# Patient Record
Sex: Female | Born: 1960 | Race: White | Hispanic: No | Marital: Married | Smoking: Never smoker
Health system: Southern US, Community
[De-identification: ages and names within clinical notes are randomized; demographics above are authoritative.]

## PROBLEM LIST (undated history)

## (undated) DIAGNOSIS — E7800 Pure hypercholesterolemia, unspecified: Secondary | ICD-10-CM

## (undated) HISTORY — DX: Pure hypercholesterolemia, unspecified: E78.00

## (undated) HISTORY — PX: OVARIAN CYST REMOVAL: SHX89

## (undated) HISTORY — PX: TONSILLECTOMY: SUR1361

---

## 1997-09-23 ENCOUNTER — Other Ambulatory Visit: Admission: RE | Admit: 1997-09-23 | Discharge: 1997-09-23 | Payer: Self-pay | Admitting: Obstetrics and Gynecology

## 1999-12-12 ENCOUNTER — Inpatient Hospital Stay (HOSPITAL_COMMUNITY): Admission: AD | Admit: 1999-12-12 | Discharge: 1999-12-14 | Payer: Self-pay | Admitting: Obstetrics and Gynecology

## 2000-01-26 ENCOUNTER — Other Ambulatory Visit: Admission: RE | Admit: 2000-01-26 | Discharge: 2000-01-26 | Payer: Self-pay | Admitting: Obstetrics and Gynecology

## 2004-03-22 ENCOUNTER — Ambulatory Visit: Payer: Self-pay | Admitting: Obstetrics and Gynecology

## 2005-10-04 ENCOUNTER — Ambulatory Visit: Payer: Self-pay | Admitting: Obstetrics and Gynecology

## 2007-05-01 ENCOUNTER — Ambulatory Visit: Payer: Self-pay | Admitting: Obstetrics and Gynecology

## 2008-09-02 ENCOUNTER — Ambulatory Visit: Payer: Self-pay | Admitting: Obstetrics and Gynecology

## 2009-12-02 ENCOUNTER — Ambulatory Visit: Payer: Self-pay | Admitting: Unknown Physician Specialty

## 2010-12-29 ENCOUNTER — Ambulatory Visit: Payer: Self-pay | Admitting: Unknown Physician Specialty

## 2015-05-18 ENCOUNTER — Other Ambulatory Visit: Payer: Self-pay | Admitting: *Deleted

## 2015-05-18 ENCOUNTER — Inpatient Hospital Stay
Admission: RE | Admit: 2015-05-18 | Discharge: 2015-05-18 | Disposition: A | Discharge status: Home / Self Care | Payer: Self-pay | Source: Ambulatory Visit | Attending: *Deleted | Admitting: *Deleted

## 2015-05-18 ENCOUNTER — Other Ambulatory Visit: Payer: Self-pay | Admitting: Obstetrics & Gynecology

## 2015-05-18 DIAGNOSIS — R928 Other abnormal and inconclusive findings on diagnostic imaging of breast: Secondary | ICD-10-CM

## 2015-05-18 DIAGNOSIS — Z9289 Personal history of other medical treatment: Secondary | ICD-10-CM

## 2015-05-28 ENCOUNTER — Ambulatory Visit: Payer: Self-pay

## 2015-05-28 ENCOUNTER — Ambulatory Visit
Admission: RE | Admit: 2015-05-28 | Discharge: 2015-05-28 | Disposition: A | Discharge status: Home / Self Care | Payer: BLUE CROSS/BLUE SHIELD | Source: Ambulatory Visit | Attending: Obstetrics & Gynecology | Admitting: Obstetrics & Gynecology

## 2015-05-28 DIAGNOSIS — R928 Other abnormal and inconclusive findings on diagnostic imaging of breast: Secondary | ICD-10-CM | POA: Insufficient documentation

## 2016-04-11 ENCOUNTER — Other Ambulatory Visit: Payer: Self-pay | Admitting: Internal Medicine

## 2016-04-11 DIAGNOSIS — Z1231 Encounter for screening mammogram for malignant neoplasm of breast: Secondary | ICD-10-CM

## 2016-05-23 ENCOUNTER — Ambulatory Visit: Payer: BLUE CROSS/BLUE SHIELD

## 2016-06-07 ENCOUNTER — Ambulatory Visit: Payer: BLUE CROSS/BLUE SHIELD

## 2016-07-07 ENCOUNTER — Ambulatory Visit: Payer: BLUE CROSS/BLUE SHIELD | Attending: Internal Medicine

## 2016-08-15 ENCOUNTER — Encounter: Payer: Self-pay | Admitting: Obstetrics and Gynecology

## 2016-08-16 ENCOUNTER — Encounter: Payer: Self-pay | Admitting: Obstetrics and Gynecology

## 2016-08-17 ENCOUNTER — Encounter: Payer: Self-pay | Admitting: Obstetrics and Gynecology

## 2016-08-21 ENCOUNTER — Inpatient Hospital Stay: Admission: RE | Admit: 2016-08-21 | Payer: BLUE CROSS/BLUE SHIELD | Source: Ambulatory Visit

## 2016-09-19 ENCOUNTER — Ambulatory Visit
Admission: RE | Admit: 2016-09-19 | Discharge: 2016-09-19 | Disposition: A | Discharge status: Home / Self Care | Payer: BLUE CROSS/BLUE SHIELD | Source: Ambulatory Visit | Attending: Internal Medicine | Admitting: Internal Medicine

## 2016-09-19 DIAGNOSIS — Z1231 Encounter for screening mammogram for malignant neoplasm of breast: Secondary | ICD-10-CM | POA: Diagnosis present

## 2016-10-10 ENCOUNTER — Encounter: Payer: Self-pay | Admitting: Obstetrics and Gynecology

## 2016-11-07 ENCOUNTER — Encounter: Payer: Self-pay | Admitting: Obstetrics and Gynecology

## 2016-12-06 ENCOUNTER — Encounter: Payer: Self-pay | Admitting: Obstetrics and Gynecology

## 2016-12-12 ENCOUNTER — Ambulatory Visit (INDEPENDENT_AMBULATORY_CARE_PROVIDER_SITE_OTHER): Payer: BLUE CROSS/BLUE SHIELD | Admitting: Obstetrics and Gynecology

## 2016-12-12 ENCOUNTER — Encounter: Payer: Self-pay | Admitting: Obstetrics and Gynecology

## 2016-12-12 ENCOUNTER — Other Ambulatory Visit: Payer: Self-pay | Admitting: Obstetrics and Gynecology

## 2016-12-12 VITALS — BP 124/76 | HR 65 | Ht 63.5 in | Wt 141.0 lb

## 2016-12-12 DIAGNOSIS — Z01419 Encounter for gynecological examination (general) (routine) without abnormal findings: Secondary | ICD-10-CM | POA: Diagnosis not present

## 2016-12-12 NOTE — Patient Instructions (Signed)
Thank you for enrolling in MyChart. Please follow the instructions below to securely access your online medical record. MyChart allows you to send messages to your doctor, view your test results, renew your prescriptions, schedule appointments, and more.  How Do I Sign Up? 1. In your Internet browser, go to [email or web address]. 2. Click on the New  User? link in the Sign In box.  3. Enter your MyChart Access Code exactly as it appears below. You will not need to use this code after you have completed the sign-up process. If you do not sign up before the expiration date, you must request a new code. MyChart Access Code: [email or web address] [email or web address] Expires: 02/10/2017 10:21 AM  4. Enter the last four digits of your Social Security Number (xxxx) and Date of Birth (mm/dd/yyyy) as indicated and click Next. You will be taken to the next sign-up page. 5. Create a MyChart ID. This will be your MyChart login ID and cannot be changed, so think of one that is secure and easy to remember. 6. Create a MyChart password. You can change your password at any time. 7. Enter your Password Reset Question and Answer and click Next. This can be used at a later time if you forget your password.  8. Select your communication preference, and if applicable enter your e-mail address. You will receive e-mail notification when new information is available in MyChart by choosing to receive e-mail notifications and filling in your e-mail. 9. Click Sign In. You can now view your medical record.   Additional Information If you have questions, you can email [email or web address] or call [phone] to talk to our MyChart staff. Remember, MyChart is NOT to be used for urgent needs. For medical emergencies, dial 911.

## 2016-12-12 NOTE — Progress Notes (Signed)
Subjective:   Natalie Chandler is a 56 y.o. 544P4 Caucasian female here for a routine well-woman exam.  No LMP recorded. Patient is postmenopausal.    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  Past Medical History Past Medical History:  Diagnosis Date  . High cholesterol     Past Surgical History Past Surgical History:  Procedure Laterality Date  . OVARIAN CYST REMOVAL    . TONSILLECTOMY      Gynecologic History G4P4  No LMP recorded. Patient is postmenopausal. Contraception: post menopausal status Last Pap: 2014. Results were: normal Last mammogram: 2018. Results were: normal   Obstetric History OB History  Gravida Para Term Preterm AB Living  4 4          SAB TAB Ectopic Multiple Live Births               # Outcome Date GA Lbr Len/2nd Weight Sex Delivery Anes PTL Lv  4 Para 2001    F Vag-Spont     3 Para 1997    F Vag-Spont     2 Para 1993    F Vag-Spont     1 Para 1991    F Vag-Spont         Current Medications No current outpatient prescriptions on file prior to visit.   No current facility-administered medications on file prior to visit.     Review of Systems Patient denies any headaches, blurred vision, shortness of breath, chest pain, abdominal pain, problems with bowel movements, urination, or intercourse.  Objective:  BP 124/76   Pulse 65   Ht 5' 3.5" (1.613 m)   Wt 141 lb (64 kg)   BMI 24.59 kg/m  Physical Exam  General:  Well developed, well nourished, no acute distress. She is alert  and oriented x3. Skin:  Warm and dry Neck:  Midline trachea, no thyromegaly or nodules Cardiovascular: Regular rate and rhythm, no murmur heard Lungs:  Effort normal, all lung fields clear to auscultation bilaterally Breasts:  No dominant palpable mass, retraction, or nipple discharge Abdomen:  Soft, non tender, no hepatosplenomegaly or masses Pelvic:  External genitalia is normal in appearance.  The vagina is normal in appearance. The cervix is bulbous, no CMT.  Thin prep pap is done with HR HPV cotesting. Uterus is felt to be normal size, shape, and contour.  No adnexal masses or tenderness noted. Extremities:  No swelling or varicosities noted Psych:  She has a normal mood and affect  Assessment:   Healthy well-woman exam HRT  Plan:   F/U 1 year for AE, or sooner if needed  Gwenda Heiner Suzan NailerN Allycia Pitz, CNM

## 2016-12-13 LAB — CYTOLOGY - PAP

## 2017-08-27 ENCOUNTER — Other Ambulatory Visit: Payer: Self-pay | Admitting: Internal Medicine

## 2017-08-27 ENCOUNTER — Other Ambulatory Visit: Payer: Self-pay | Admitting: Obstetrics and Gynecology

## 2017-08-27 DIAGNOSIS — Z1231 Encounter for screening mammogram for malignant neoplasm of breast: Secondary | ICD-10-CM

## 2017-09-21 ENCOUNTER — Ambulatory Visit
Admission: RE | Admit: 2017-09-21 | Discharge: 2017-09-21 | Disposition: A | Discharge status: Home / Self Care | Payer: BLUE CROSS/BLUE SHIELD | Source: Ambulatory Visit | Attending: Obstetrics and Gynecology | Admitting: Obstetrics and Gynecology

## 2017-09-21 DIAGNOSIS — Z1231 Encounter for screening mammogram for malignant neoplasm of breast: Secondary | ICD-10-CM

## 2017-11-28 ENCOUNTER — Ambulatory Visit: Payer: 59 | Admitting: Obstetrics and Gynecology

## 2017-11-28 ENCOUNTER — Encounter: Payer: Self-pay | Admitting: Obstetrics and Gynecology

## 2017-11-28 VITALS — BP 132/90 | HR 59 | Ht 63.0 in | Wt 144.7 lb

## 2017-11-28 DIAGNOSIS — K644 Residual hemorrhoidal skin tags: Secondary | ICD-10-CM

## 2017-11-28 MED ORDER — HYDROCORTISONE ACE-PRAMOXINE 1-1 % RE FOAM: 1.0000 | Freq: Two times a day (BID) | RECTAL | 1 refills | Status: DC

## 2017-11-28 NOTE — Patient Instructions (Signed)
Hemorrhoids Hemorrhoids are swollen veins in and around the rectum or anus. There are two types of hemorrhoids:  Internal hemorrhoids. These occur in the veins that are just inside the rectum. They may poke through to the outside and become irritated and painful.  External hemorrhoids. These occur in the veins that are outside of the anus and can be felt as a painful swelling or hard lump near the anus.  Most hemorrhoids do not cause serious problems, and they can be managed with home treatments such as diet and lifestyle changes. If home treatments do not help your symptoms, procedures can be done to shrink or remove the hemorrhoids. What are the causes? This condition is caused by increased pressure in the anal area. This pressure may result from various things, including:  Constipation.  Straining to have a bowel movement.  Diarrhea.  Pregnancy.  Obesity.  Sitting for long periods of time.  Heavy lifting or other activity that causes you to strain.  Anal sex.  What are the signs or symptoms? Symptoms of this condition include:  Pain.  Anal itching or irritation.  Rectal bleeding.  Leakage of stool (feces).  Anal swelling.  One or more lumps around the anus.  How is this diagnosed? This condition can often be diagnosed through a visual exam. Other exams or tests may also be done, such as:  Examination of the rectal area with a gloved hand (digital rectal exam).  Examination of the anal canal using a small tube (anoscope).  A blood test, if you have lost a significant amount of blood.  A test to look inside the colon (sigmoidoscopy or colonoscopy).  How is this treated? This condition can usually be treated at home. However, various procedures may be done if dietary changes, lifestyle changes, and other home treatments do not help your symptoms. These procedures can help make the hemorrhoids smaller or remove them completely. Some of these procedures involve  surgery, and others do not. Common procedures include:  Rubber band ligation. Rubber bands are placed at the base of the hemorrhoids to cut off the blood supply to them.  Sclerotherapy. Medicine is injected into the hemorrhoids to shrink them.  Infrared coagulation. A type of light energy is used to get rid of the hemorrhoids.  Hemorrhoidectomy surgery. The hemorrhoids are surgically removed, and the veins that supply them are tied off.  Stapled hemorrhoidopexy surgery. A circular stapling device is used to remove the hemorrhoids and use staples to cut off the blood supply to them.  Follow these instructions at home: Eating and drinking  Eat foods that have a lot of fiber in them, such as whole grains, beans, nuts, fruits, and vegetables. Ask your health care provider about taking products that have added fiber (fiber supplements).  Drink enough fluid to keep your urine clear or pale yellow. Managing pain and swelling  Take warm sitz baths for 20 minutes, 3-4 times a day to ease pain and discomfort.  If directed, apply ice to the affected area. Using ice packs between sitz baths may be helpful. ? Put ice in a plastic bag. ? Place a towel between your skin and the bag. ? Leave the ice on for 20 minutes, 2-3 times a day. General instructions  Take over-the-counter and prescription medicines only as told by your health care provider.  Use medicated creams or suppositories as told.  Exercise regularly.  Go to the bathroom when you have the urge to have a bowel movement. Do not wait.    Avoid straining to have bowel movements.  Keep the anal area dry and clean. Use wet toilet paper or moist towelettes after a bowel movement.  Do not sit on the toilet for long periods of time. This increases blood pooling and pain. Contact a health care provider if:  You have increasing pain and swelling that are not controlled by treatment or medicine.  You have uncontrolled bleeding.  You  have difficulty having a bowel movement, or you are unable to have a bowel movement.  You have pain or inflammation outside the area of the hemorrhoids. This information is not intended to replace advice given to you by your health care provider. Make sure you discuss any questions you have with your health care provider. Document Released: 05/05/2000 Document Revised: 10/06/2015 Document Reviewed: 01/20/2015 Elsevier Interactive Patient Education  2018 Elsevier Inc.  

## 2017-11-28 NOTE — Progress Notes (Signed)
  Subjective:     Patient ID: Natalie Chandler, female   DOB: 11/20/1960, 57 y.o.   MRN: [id number]  HPI Patient reports that she has hemorrhoids. No relief from OTC hemorrhoid cream or suppositories. She c/o pain in tailbone region but has been in new exercise program. She now has pressure in rectum with sharp stabbing pain.She has rectal pressure that radiates into lower pelvic region. Pt has regular bowel movements without straining. Pt has been lifting a lot of heavier items during moving process recently.   Review of Systems  Constitutional: Positive for activity change.  Cardiovascular: Negative.   Gastrointestinal: Positive for rectal pain.  Genitourinary: Negative.   Musculoskeletal: Negative.        Objective:   Physical Exam A&Ox4 Well groomed female Blood pressure 132/90, pulse (!) 59, height 5\' 3"  (1.6 m), weight 144 lb 11.2 oz (65.6 kg). Rectal exam: external hemorrhoid at 10 o'clock, approximately 2cm in diameter noted, sphincter tone normal.    Assessment:    EXTERNAL Hemorrhoid      Plan:     Lidocaine gel used and hemorrhoid reinserted into rectum Prescription for proctofoam sent in and instructed on use. To abstain from hot yoga and running for one week.  RTC in 2-3 weeks for annual and recheck.    Melody Shambley,CNM

## 2017-12-19 ENCOUNTER — Ambulatory Visit (INDEPENDENT_AMBULATORY_CARE_PROVIDER_SITE_OTHER): Payer: 59 | Admitting: Obstetrics and Gynecology

## 2017-12-19 ENCOUNTER — Encounter: Payer: Self-pay | Admitting: Obstetrics and Gynecology

## 2017-12-19 VITALS — BP 127/79 | HR 74 | Ht 63.5 in | Wt 145.8 lb

## 2017-12-19 DIAGNOSIS — Z01419 Encounter for gynecological examination (general) (routine) without abnormal findings: Secondary | ICD-10-CM | POA: Diagnosis not present

## 2017-12-19 MED ORDER — ESTRADIOL-NORETHINDRONE ACET 1-0.5 MG PO TABS: 1.0000 | ORAL_TABLET | Freq: Every day | ORAL | 4 refills | Status: DC

## 2017-12-19 MED ORDER — ROSUVASTATIN CALCIUM 5 MG PO TABS: 5.0000 mg | ORAL_TABLET | Freq: Every day | ORAL | 4 refills | Status: AC

## 2017-12-19 NOTE — Progress Notes (Signed)
Subjective:   Natalie HoopsStacy Klosowski is a 57 y.o. 644P4 Caucasian female here for a routine well-woman exam.  No LMP recorded. Patient is postmenopausal.    Current complaints: none PCP: mark Hyacinth MeekerMiller       PCP does labs  Social History: Sexual: heterosexual Marital Status: married Living situation: with family Occupation: K4 teacher Tobacco/alcohol: no tobacco use Illicit drugs: no history of illicit drug use  The following portions of the patient's history were reviewed and updated as appropriate: allergies, current medications, past family history, past medical history, past social history, past surgical history and problem list.  Past Medical History Past Medical History:  Diagnosis Date  . High cholesterol     Past Surgical History Past Surgical History:  Procedure Laterality Date  . OVARIAN CYST REMOVAL    . TONSILLECTOMY      Gynecologic History G4P4  No LMP recorded. Patient is postmenopausal. Contraception: post menopausal status Last Pap: 2018. Results were: normal Last mammogram: 10/2017. Results were: normal  Obstetric History OB History  Gravida Para Term Preterm AB Living  4 4          SAB TAB Ectopic Multiple Live Births               # Outcome Date GA Lbr Len/2nd Weight Sex Delivery Anes PTL Lv  4 Para 2001    F Vag-Spont     3 Para 1997    F Vag-Spont     2 Para 1993    F Vag-Spont     1 Para 1991    F Vag-Spont       Current Medications Current Outpatient Medications on File Prior to Visit  Medication Sig Dispense Refill  . hydrocortisone-pramoxine (PROCTOFOAM HC) rectal foam Place 1 applicator rectally 2 (two) times daily. (Patient not taking: Reported on 12/19/2017) 10 g 1   No current facility-administered medications on file prior to visit.     Review of Systems Patient denies any headaches, blurred vision, shortness of breath, chest pain, abdominal pain, problems with bowel movements, urination, or intercourse.  Objective:  BP 127/79   Pulse 74    Ht 5' 3.5" (1.613 m)   Wt 145 lb 12.8 oz (66.1 kg)   BMI 25.42 kg/m  Physical Exam  General:  Well developed, well nourished, no acute distress. She is alert and oriented x3. Skin:  Warm and dry Neck:  Midline trachea, no thyromegaly or nodules Cardiovascular: Regular rate and rhythm, no murmur heard Lungs:  Effort normal, all lung fields clear to auscultation bilaterally Breasts:  No dominant palpable mass, retraction, or nipple discharge Abdomen:  Soft, non tender, no hepatosplenomegaly or masses Pelvic:  External genitalia is normal in appearance.  The vagina is normal in appearance. The cervix is bulbous, no CMT.  Thin prep pap is not done . Uterus is felt to be normal size, shape, and contour.  No adnexal masses or tenderness noted. Extremities:  No swelling or varicosities noted Psych:  She has a normal mood and affect  Assessment:   Healthy well-woman exam  Plan:   F/U 1 year for AE, or sooner if needed Mammogram ordered  Zalan Shidler Suzan NailerN Danajah Birdsell, CNM

## 2017-12-24 ENCOUNTER — Other Ambulatory Visit: Payer: Self-pay | Admitting: Obstetrics and Gynecology

## 2018-07-25 ENCOUNTER — Other Ambulatory Visit: Payer: Self-pay | Admitting: *Deleted

## 2018-07-25 ENCOUNTER — Telehealth: Payer: Self-pay | Admitting: Obstetrics and Gynecology

## 2018-07-25 MED ORDER — FLUCONAZOLE 150 MG PO TABS: 150.0000 mg | ORAL_TABLET | Freq: Once | ORAL | 3 refills | Status: AC

## 2018-07-25 NOTE — Telephone Encounter (Signed)
The patient states she has another yeast infection and is asking for a refill of her Diflucan to be called in to her pharmacy, [facility], please advise, thanks.

## 2018-07-25 NOTE — Telephone Encounter (Signed)
Done-ac 

## 2018-10-29 ENCOUNTER — Other Ambulatory Visit: Payer: Self-pay | Admitting: Obstetrics and Gynecology

## 2018-10-29 DIAGNOSIS — Z1231 Encounter for screening mammogram for malignant neoplasm of breast: Secondary | ICD-10-CM

## 2018-11-06 ENCOUNTER — Ambulatory Visit
Admission: RE | Admit: 2018-11-06 | Discharge: 2018-11-06 | Disposition: A | Discharge status: Home / Self Care | Payer: 59 | Source: Ambulatory Visit | Attending: Obstetrics and Gynecology | Admitting: Obstetrics and Gynecology

## 2018-11-06 ENCOUNTER — Other Ambulatory Visit: Payer: Self-pay

## 2018-11-06 DIAGNOSIS — Z1231 Encounter for screening mammogram for malignant neoplasm of breast: Secondary | ICD-10-CM

## 2018-12-24 ENCOUNTER — Encounter: Payer: Self-pay | Admitting: Obstetrics and Gynecology

## 2018-12-24 ENCOUNTER — Ambulatory Visit (INDEPENDENT_AMBULATORY_CARE_PROVIDER_SITE_OTHER): Payer: 59 | Admitting: Obstetrics and Gynecology

## 2018-12-24 ENCOUNTER — Other Ambulatory Visit: Payer: Self-pay

## 2018-12-24 VITALS — BP 128/84 | HR 86 | Ht 63.5 in | Wt 144.3 lb

## 2018-12-24 DIAGNOSIS — Z780 Asymptomatic menopausal state: Secondary | ICD-10-CM | POA: Diagnosis not present

## 2018-12-24 DIAGNOSIS — Z01419 Encounter for gynecological examination (general) (routine) without abnormal findings: Secondary | ICD-10-CM | POA: Diagnosis not present

## 2018-12-24 MED ORDER — ESTRADIOL-NORETHINDRONE ACET 1-0.5 MG PO TABS: 1.0000 | ORAL_TABLET | Freq: Every day | ORAL | 4 refills | Status: AC

## 2018-12-24 MED ORDER — PROCTOFOAM HC 1-1 % EX FOAM: 1.0000 | Freq: Two times a day (BID) | CUTANEOUS | 1 refills | Status: AC

## 2018-12-24 NOTE — Progress Notes (Signed)
SUBJECTIVE:  58 y.o.G4P4  caucasian female for annual routine checkup. No LMP recorded, pt is postmenopausal. No current complaints.   PCP is Emily Filbert, MD who performes her labwork.  Sexually active with single female partner. No concerns.   Sexual: heterosexual Marital Status: married Living situation: with family Occupation: K4 teacher Tobacco/alcohol: no tobacco use Illicit drugs: no history of illicit drug use    Current Outpatient Medications  Medication Sig Dispense Refill  . estradiol-norethindrone (ACTIVELLA) 1-0.5 MG tablet Take 1 tablet by mouth daily. 90 tablet 4  . rosuvastatin (CRESTOR) 5 MG tablet Take 1 tablet (5 mg total) by mouth daily. 90 tablet 4  . PROCTOFOAM HC rectal foam PLACE 1 APPLICATOR RECTALLY TWICE DAILY (Patient not taking: Reported on 12/24/2018) 10 g 1   No current facility-administered medications for this visit.    Allergies: Penicillins and Shellfish allergy  No LMP recorded. Patient is postmenopausal.  ROS:  Feeling well. No dyspnea or chest pain on exertion.  No abdominal pain, change in bowel habits, black or bloody stools.  No urinary tract symptoms. GYN ROS: normal menses, no abnormal bleeding, pelvic pain or discharge, no breast pain or new or enlarging lumps on self exam, no vaginal bleeding. No neurological complaints.  OBJECTIVE:  The patient appears well, alert, oriented x 3, in no distress. BP 128/84   Pulse 86   Ht 5' 3.5" (1.613 m)   Wt 65.5 kg   BMI 25.16 kg/m  ENT normal.  Neck supple. No adenopathy or thyromegaly. PERLA. Lungs are clear, good air entry, no wheezes, rhonchi or rales. S1 and S2 normal, no murmurs, regular rate and rhythm. Abdomen soft without tenderness, guarding, mass or organomegaly. Extremities show no edema, normal peripheral pulses. Neurological is normal, no focal findings.  BREAST EXAM: breasts appear normal, no suspicious masses, no skin or nipple changes or axillary nodes  PELVIC EXAM: normal external  genitalia, vulva, vagina, cervix, uterus and adnexa. Bimanual exam performed.   ASSESSMENT:  well woman Post-Menopausal  PLAN:  Return in 1 yr for AE or prn Refilled Activella and Proctofoam Rx   Silvestre Mesi, SNM

## 2018-12-26 ENCOUNTER — Encounter: Payer: 59 | Admitting: Obstetrics and Gynecology

## 2019-06-28 ENCOUNTER — Ambulatory Visit: Payer: 59 | Attending: Internal Medicine

## 2019-06-28 DIAGNOSIS — Z23 Encounter for immunization: Secondary | ICD-10-CM | POA: Insufficient documentation

## 2019-06-28 NOTE — Progress Notes (Signed)
   Covid-19 Vaccination Clinic  Name:  Natalie Chandler    MRN: [id number] DOB: 01-Dec-1960  06/28/2019  Natalie Chandler was observed post Covid-19 immunization for 30 minutes based on pre-vaccination screening without incidence. She was provided with Vaccine Information Sheet and instruction to access the V-Safe system.   Natalie Chandler was instructed to call 911 with any severe reactions post vaccine: Marland Kitchen Difficulty breathing  . Swelling of your face and throat  . A fast heartbeat  . A bad rash all over your body  . Dizziness and weakness    Immunizations Administered    Name Date Dose VIS Date Route   Pfizer COVID-19 Vaccine 06/28/2019 10:58 AM 0.3 mL 05/02/2019 Intramuscular   Manufacturer: [employer], [employer]   Lot: [id number]   NDC: [address]-8

## 2019-07-22 ENCOUNTER — Ambulatory Visit: Payer: 59 | Attending: Internal Medicine

## 2019-07-22 DIAGNOSIS — Z23 Encounter for immunization: Secondary | ICD-10-CM | POA: Insufficient documentation

## 2019-07-22 NOTE — Progress Notes (Signed)
   Covid-19 Vaccination Clinic  Name:  Natalie Chandler    MRN: [id number] DOB: 1961/02/24  07/22/2019  Natalie Chandler was observed post Covid-19 immunization for 15 minutes without incident. She was provided with Vaccine Information Sheet and instruction to access the V-Safe system.   Natalie Chandler was instructed to call 911 with any severe reactions post vaccine: Marland Kitchen Difficulty breathing  . Swelling of face and throat  . A fast heartbeat  . A bad rash all over body  . Dizziness and weakness   Immunizations Administered    Name Date Dose VIS Date Route   Pfizer COVID-19 Vaccine 07/22/2019  9:35 AM 0.3 mL 05/02/2019 Intramuscular   Manufacturer: [employer], [employer]   Lot: [id number]   NDC: [address]-6

## 2019-07-23 ENCOUNTER — Ambulatory Visit: Payer: 59

## 2019-12-19 ENCOUNTER — Other Ambulatory Visit: Payer: Self-pay | Admitting: Internal Medicine

## 2019-12-19 DIAGNOSIS — Z1231 Encounter for screening mammogram for malignant neoplasm of breast: Secondary | ICD-10-CM

## 2019-12-22 ENCOUNTER — Other Ambulatory Visit: Payer: Self-pay

## 2019-12-22 ENCOUNTER — Encounter (INDEPENDENT_AMBULATORY_CARE_PROVIDER_SITE_OTHER): Payer: Self-pay

## 2019-12-22 ENCOUNTER — Ambulatory Visit
Admission: RE | Admit: 2019-12-22 | Discharge: 2019-12-22 | Disposition: A | Discharge status: Home / Self Care | Payer: Commercial Managed Care - PPO | Source: Ambulatory Visit | Attending: Internal Medicine | Admitting: Internal Medicine

## 2019-12-22 DIAGNOSIS — Z1231 Encounter for screening mammogram for malignant neoplasm of breast: Secondary | ICD-10-CM | POA: Insufficient documentation

## 2021-02-21 ENCOUNTER — Other Ambulatory Visit: Payer: Self-pay | Admitting: Internal Medicine

## 2021-02-21 DIAGNOSIS — Z1231 Encounter for screening mammogram for malignant neoplasm of breast: Secondary | ICD-10-CM

## 2021-03-02 ENCOUNTER — Other Ambulatory Visit: Payer: Self-pay

## 2021-03-02 ENCOUNTER — Ambulatory Visit
Admission: RE | Admit: 2021-03-02 | Discharge: 2021-03-02 | Disposition: A | Discharge status: Home / Self Care | Payer: BC Managed Care – PPO | Source: Ambulatory Visit | Attending: Internal Medicine | Admitting: Internal Medicine

## 2021-03-02 DIAGNOSIS — Z1231 Encounter for screening mammogram for malignant neoplasm of breast: Secondary | ICD-10-CM | POA: Insufficient documentation

## 2022-01-24 ENCOUNTER — Other Ambulatory Visit: Payer: Self-pay | Admitting: Internal Medicine

## 2022-01-24 DIAGNOSIS — Z1231 Encounter for screening mammogram for malignant neoplasm of breast: Secondary | ICD-10-CM

## 2022-03-06 ENCOUNTER — Ambulatory Visit
Admission: RE | Admit: 2022-03-06 | Discharge: 2022-03-06 | Disposition: A | Discharge status: Home / Self Care | Payer: BC Managed Care – PPO | Source: Ambulatory Visit | Attending: Internal Medicine | Admitting: Internal Medicine

## 2022-03-06 DIAGNOSIS — Z1231 Encounter for screening mammogram for malignant neoplasm of breast: Secondary | ICD-10-CM | POA: Diagnosis present

## 2023-02-05 ENCOUNTER — Other Ambulatory Visit: Payer: Self-pay | Admitting: Internal Medicine

## 2023-02-05 DIAGNOSIS — Z1231 Encounter for screening mammogram for malignant neoplasm of breast: Secondary | ICD-10-CM

## 2023-03-13 ENCOUNTER — Ambulatory Visit
Admission: RE | Admit: 2023-03-13 | Discharge: 2023-03-13 | Disposition: A | Discharge status: Home / Self Care | Payer: BC Managed Care – PPO | Source: Ambulatory Visit | Attending: Internal Medicine | Admitting: Internal Medicine

## 2023-03-13 DIAGNOSIS — Z1231 Encounter for screening mammogram for malignant neoplasm of breast: Secondary | ICD-10-CM | POA: Diagnosis present

## 2023-08-09 IMAGING — MG MM DIGITAL SCREENING BILAT W/ TOMO AND CAD
8 series · 8 of 24 positions shown · non-contrast
Comparison: Previous exam(s).

CLINICAL DATA: Screening.

EXAM:
DIGITAL SCREENING BILATERAL MAMMOGRAM WITH TOMOSYNTHESIS AND CAD
TECHNIQUE: Bilateral screening digital craniocaudal and mediolateral oblique
mammograms were obtained. Bilateral screening digital breast
tomosynthesis was performed. The images were evaluated with
computer-aided detection.

[R CC synth-2D]
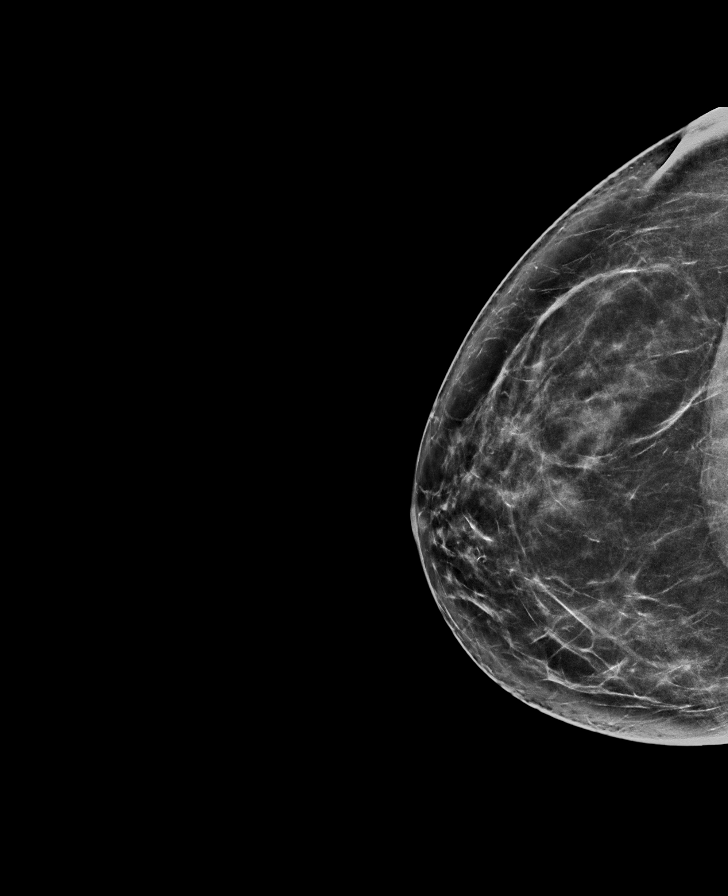

[L MLO synth-2D]
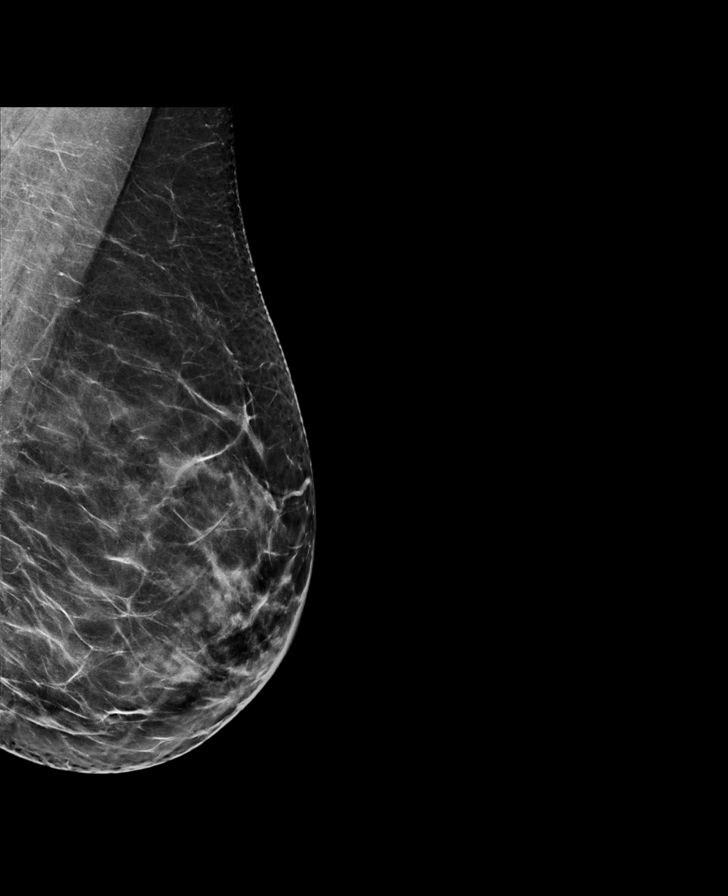

[L CC synth-2D]
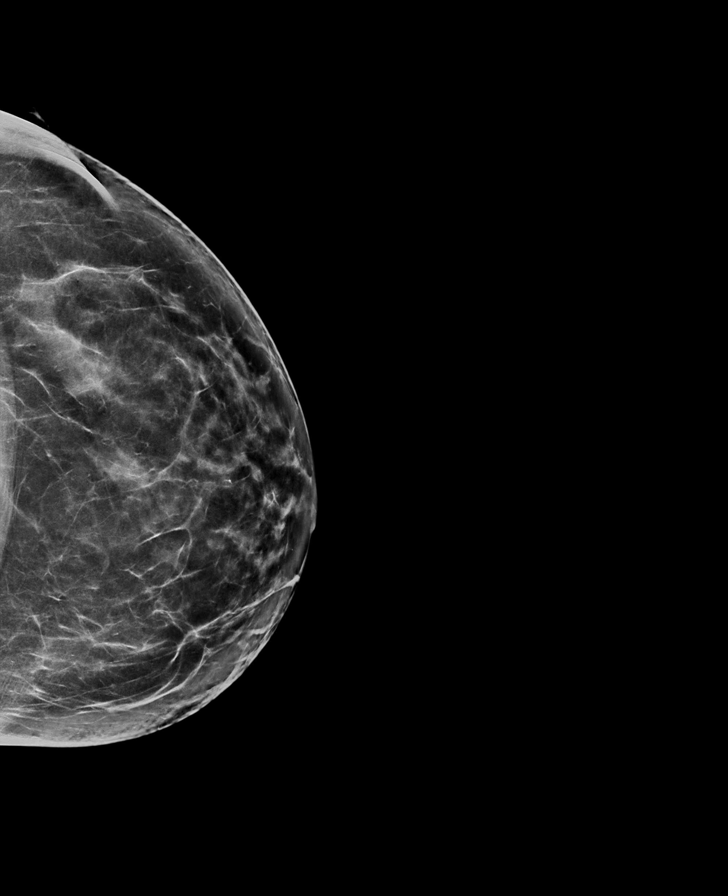

[R MLO synth-2D]
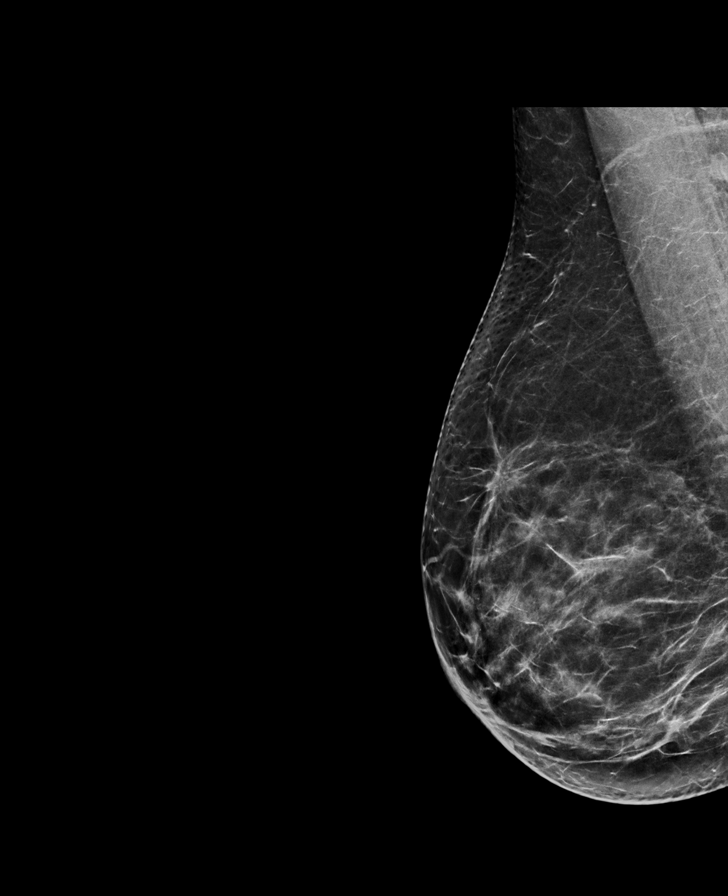

[R MLO tomo · tomo slice 38/75.0]
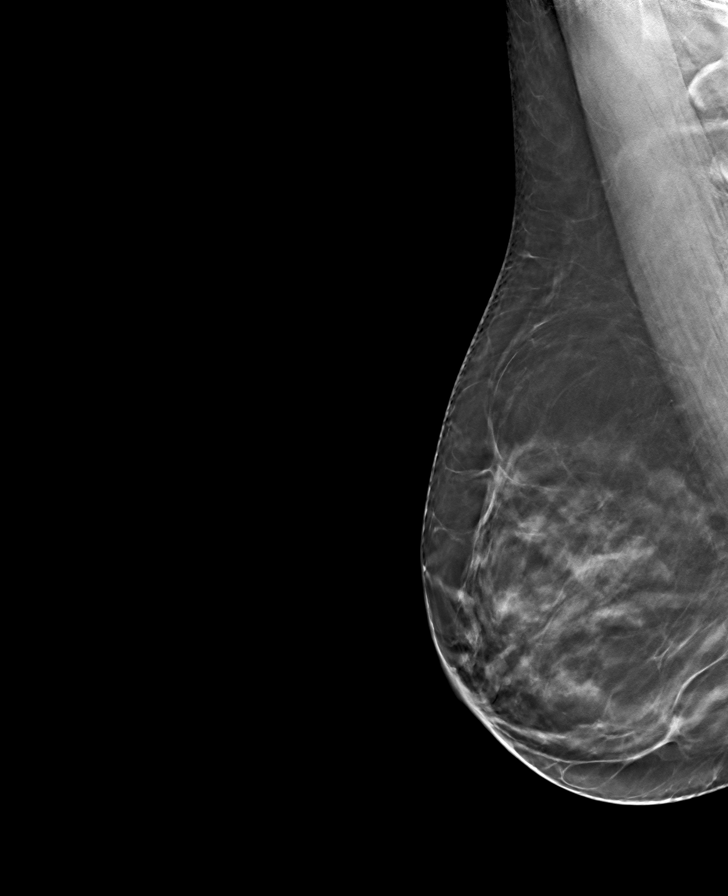

[R CC tomo · tomo slice 37/74.0]
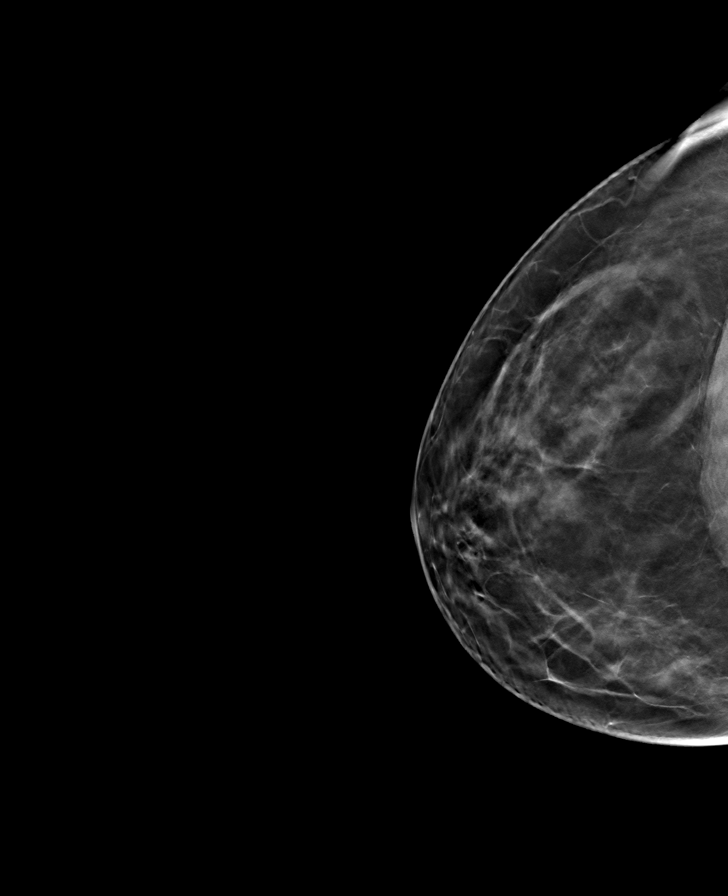

[L MLO tomo · tomo slice 40/79.0]
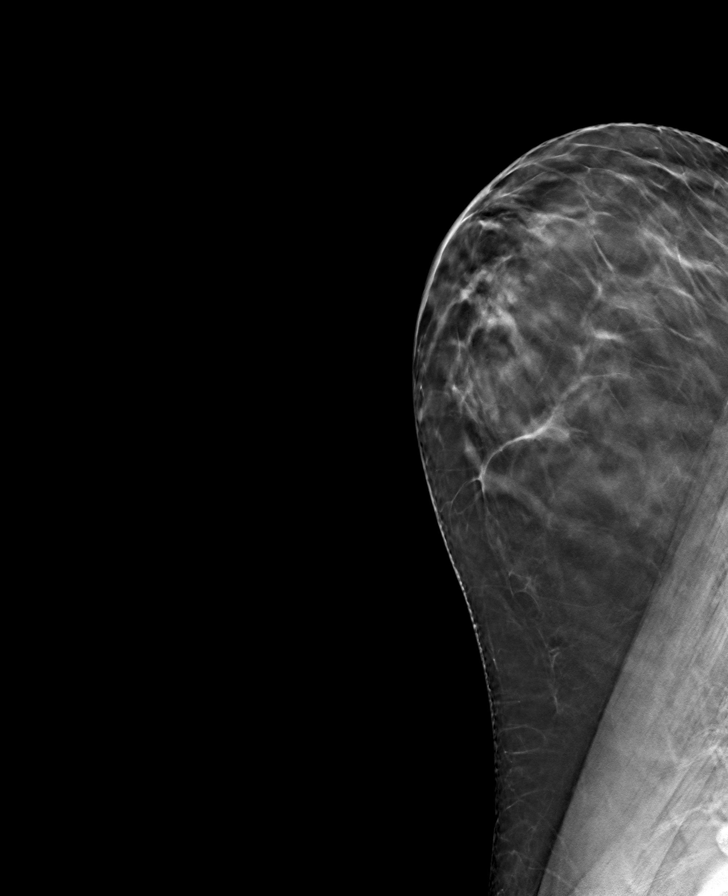

[L CC tomo · tomo slice 43/84.0]
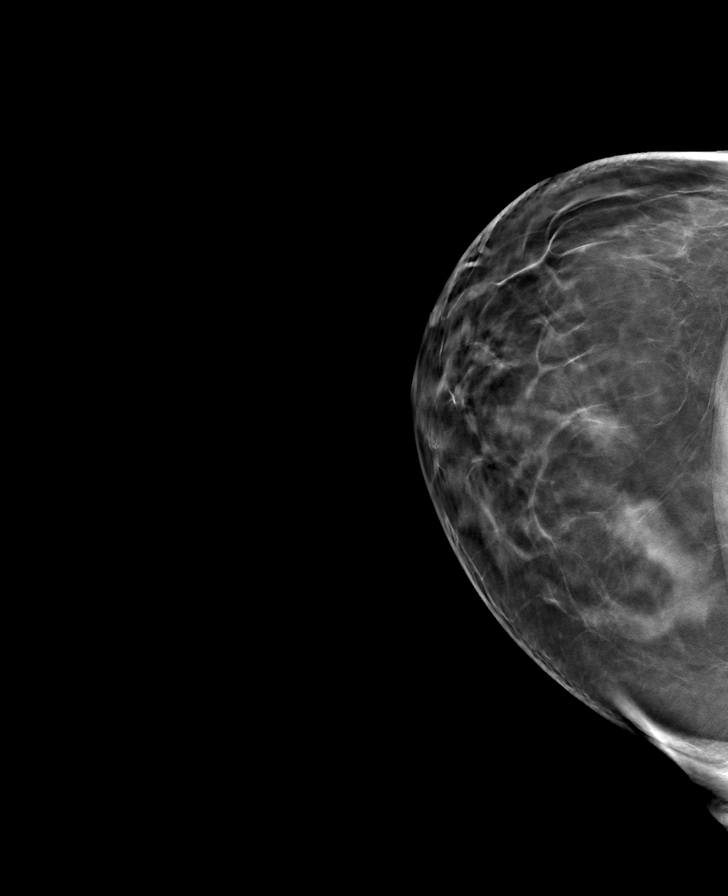

[8 of 24 positions shown; findings below may reference images not displayed]

ACR Breast Density Category c: The breast tissue is heterogeneously
dense, which may obscure small masses.
FINDINGS: There are no findings suspicious for malignancy.
IMPRESSION: No mammographic evidence of malignancy. A result letter of this
screening mammogram will be mailed directly to the patient.

RECOMMENDATION:
Screening mammogram in one year. (Code:Q3-W-BC3)

BI-RADS CATEGORY  1: Negative.

## 2024-04-07 ENCOUNTER — Other Ambulatory Visit: Payer: Self-pay | Admitting: Internal Medicine

## 2024-04-07 DIAGNOSIS — Z1231 Encounter for screening mammogram for malignant neoplasm of breast: Secondary | ICD-10-CM

## 2024-05-19 ENCOUNTER — Ambulatory Visit
Admission: RE | Admit: 2024-05-19 | Discharge: 2024-05-19 | Disposition: A | Discharge status: Home / Self Care | Source: Ambulatory Visit | Attending: Internal Medicine | Admitting: Internal Medicine

## 2024-05-19 DIAGNOSIS — Z1231 Encounter for screening mammogram for malignant neoplasm of breast: Secondary | ICD-10-CM | POA: Insufficient documentation
